# Patient Record
Sex: Male | Born: 1990 | Race: White | Hispanic: No
Health system: Southern US, Community
[De-identification: ages and names within clinical notes are randomized; demographics above are authoritative.]

## PROBLEM LIST (undated history)

## (undated) DIAGNOSIS — F840 Autistic disorder: Secondary | ICD-10-CM

## (undated) DIAGNOSIS — L709 Acne, unspecified: Secondary | ICD-10-CM

## (undated) HISTORY — DX: Acne, unspecified: L70.9

## (undated) HISTORY — DX: Autistic disorder: F84.0

---
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
## 2012-02-22 ENCOUNTER — Emergency Department: Payer: Self-pay | Admitting: Emergency Medicine

## 2012-02-22 LAB — CBC
HCT: 48.1 % (ref 40.0–52.0)
HGB: 15.8 g/dL (ref 13.0–18.0)
RBC: 5.41 10*6/uL (ref 4.40–5.90)
WBC: 9.8 10*3/uL (ref 3.8–10.6)

## 2012-02-22 LAB — DRUG SCREEN, URINE
Barbiturates, Ur Screen: NEGATIVE (ref ?–200)
Benzodiazepine, Ur Scrn: NEGATIVE (ref ?–200)
Cannabinoid 50 Ng, Ur ~~LOC~~: NEGATIVE (ref ?–50)
MDMA (Ecstasy)Ur Screen: NEGATIVE (ref ?–500)
Phencyclidine (PCP) Ur S: NEGATIVE (ref ?–25)

## 2012-02-22 LAB — ETHANOL: Ethanol: <3 mg/dL

## 2012-02-22 LAB — COMPREHENSIVE METABOLIC PANEL
Alkaline Phosphatase: 78 U/L (ref 50–136)
Anion Gap: 10 (ref 7–16)
BUN: 12 mg/dL (ref 7–18)
Calcium, Total: 9.2 mg/dL (ref 8.5–10.1)
Chloride: 104 mmol/L (ref 98–107)
Co2: 28 mmol/L (ref 21–32)
EGFR (African American): >60
EGFR (Non-African Amer.): >60
Osmolality: 283 (ref 275–301)
Potassium: 3.8 mmol/L (ref 3.5–5.1)

## 2012-02-22 LAB — TSH: Thyroid Stimulating Horm: 2.26 u[IU]/mL

## 2012-11-10 ENCOUNTER — Other Ambulatory Visit: Payer: Self-pay | Admitting: Family Medicine

## 2013-01-18 ENCOUNTER — Other Ambulatory Visit: Payer: Self-pay | Admitting: Family Medicine

## 2013-01-21 ENCOUNTER — Encounter: Payer: Self-pay | Admitting: *Deleted

## 2013-01-22 ENCOUNTER — Telehealth: Payer: Self-pay | Admitting: Family Medicine

## 2013-01-22 NOTE — Telephone Encounter (Signed)
This will be filled out in due process. I will forward it to the front tomorrow to [facility] thank you

## 2013-01-22 NOTE — Telephone Encounter (Signed)
Notified Luann this will be filled out in due process. Will forward it to the front tomorrow to [facility] per Dr. Lorin Picket. She verbalized understanding.

## 2013-01-22 NOTE — Telephone Encounter (Signed)
Calling to check on paperwork that was faxed over yesterday for patient to get communication device.  She says the sooner she gets it the better.

## 2013-01-23 ENCOUNTER — Other Ambulatory Visit: Payer: Self-pay | Admitting: *Deleted

## 2013-01-23 MED ORDER — TOPIRAMATE 100 MG PO TABS: 100.0000 mg | ORAL_TABLET | Freq: Two times a day (BID) | ORAL | Status: DC

## 2013-01-23 MED ORDER — ARIPIPRAZOLE 10 MG PO TABS: 10.0000 mg | ORAL_TABLET | Freq: Every day | ORAL | Status: DC

## 2013-01-23 MED ORDER — SULFAMETHOXAZOLE-TRIMETHOPRIM 800-160 MG PO TABS: 1.0000 | ORAL_TABLET | Freq: Two times a day (BID) | ORAL | Status: AC

## 2013-01-23 MED ORDER — ADAPALENE 0.1 % EX GEL
Freq: Every day | CUTANEOUS | Status: AC
Start: 2013-01-23 — End: ?

## 2013-01-23 MED ORDER — TOPIRAMATE 50 MG PO TABS: 50.0000 mg | ORAL_TABLET | Freq: Two times a day (BID) | ORAL | Status: DC

## 2013-01-23 MED ORDER — LISINOPRIL 5 MG PO TABS: 5.0000 mg | ORAL_TABLET | Freq: Every day | ORAL | Status: DC

## 2013-01-29 ENCOUNTER — Telehealth: Payer: Self-pay | Admitting: Family Medicine

## 2013-01-29 NOTE — Telephone Encounter (Signed)
Faxed back request to the attention of LuAnne Devall @ [employer] @ [phone]

## 2013-01-29 NOTE — Telephone Encounter (Signed)
Closed encounter - fax went out and received with a ok/status - kal

## 2013-01-30 NOTE — Telephone Encounter (Signed)
Ob/call to [employer], to confirm last fax update, she reviewed, looks like all information is in and complete. (c) [id number] (f) [phone] ** closed off encounter - kal

## 2013-03-12 ENCOUNTER — Telehealth: Payer: Self-pay | Admitting: Family Medicine

## 2013-03-12 MED ORDER — ARIPIPRAZOLE 10 MG PO TABS: 10.0000 mg | ORAL_TABLET | Freq: Every day | ORAL | Status: DC

## 2013-03-12 NOTE — Telephone Encounter (Signed)
Refill Ability 10mg .  Seeing psych on 8/22.  [facility]  Thanks

## 2013-03-12 NOTE — Telephone Encounter (Signed)
Med sent to pharm. Mother notified.  

## 2013-06-21 ENCOUNTER — Ambulatory Visit (INDEPENDENT_AMBULATORY_CARE_PROVIDER_SITE_OTHER): Payer: Medicaid Other | Admitting: Nurse Practitioner

## 2013-06-21 DIAGNOSIS — Z23 Encounter for immunization: Secondary | ICD-10-CM

## 2013-08-10 ENCOUNTER — Other Ambulatory Visit: Payer: Self-pay | Admitting: Family Medicine

## 2013-08-12 ENCOUNTER — Telehealth: Payer: Self-pay | Admitting: *Deleted

## 2013-08-12 MED ORDER — LISINOPRIL 5 MG PO TABS: 5.0000 mg | ORAL_TABLET | Freq: Every day | ORAL | Status: DC

## 2013-08-12 MED ORDER — SULFAMETHOXAZOLE-TMP DS 800-160 MG PO TABS: 1.0000 | ORAL_TABLET | Freq: Two times a day (BID) | ORAL | Status: DC

## 2013-08-12 NOTE — Telephone Encounter (Signed)
Ok plus one refill, rec 6 mo ov next month or so

## 2013-08-12 NOTE — Telephone Encounter (Signed)
Needs refill on bactrim DS one BID #60 and lisinopril 5mg  #30 one qd.

## 2013-08-12 NOTE — Telephone Encounter (Signed)
meds sent to pharm. Mother notified.   

## 2013-10-23 ENCOUNTER — Other Ambulatory Visit: Payer: Self-pay | Admitting: *Deleted

## 2013-10-23 MED ORDER — LISINOPRIL 5 MG PO TABS: 5.0000 mg | ORAL_TABLET | Freq: Every day | ORAL | Status: DC

## 2013-11-14 ENCOUNTER — Other Ambulatory Visit: Payer: Self-pay | Admitting: *Deleted

## 2013-11-14 ENCOUNTER — Telehealth: Payer: Self-pay | Admitting: *Deleted

## 2013-11-14 MED ORDER — TOPIRAMATE 50 MG PO TABS: 50.0000 mg | ORAL_TABLET | Freq: Two times a day (BID) | ORAL | Status: DC

## 2013-11-14 NOTE — Telephone Encounter (Signed)
Med sent to pharm. Mother notified.  

## 2013-11-14 NOTE — Telephone Encounter (Signed)
Requesting refill on topiramate 50mg . Has appt with psych on 05/29. [facility] [place] [facility] [address]

## 2013-11-14 NOTE — Telephone Encounter (Signed)
Ok plus five ref 

## 2013-11-18 ENCOUNTER — Telehealth: Payer: Self-pay | Admitting: *Deleted

## 2013-11-18 ENCOUNTER — Other Ambulatory Visit: Payer: Self-pay | Admitting: *Deleted

## 2013-11-18 MED ORDER — TOPIRAMATE 100 MG PO TABS: 100.0000 mg | ORAL_TABLET | Freq: Two times a day (BID) | ORAL | Status: DC

## 2013-11-18 NOTE — Telephone Encounter (Signed)
meds sent to pharm. Mother notified.   

## 2013-11-18 NOTE — Telephone Encounter (Signed)
Requesting refill on topamax 100mg . Has appt with psych 05/29. [facility] [address] [facility] [place].

## 2013-11-18 NOTE — Telephone Encounter (Signed)
Ok two ref 

## 2014-01-03 ENCOUNTER — Telehealth: Payer: Self-pay

## 2014-01-03 MED ORDER — KETOCONAZOLE 2 % EX CREA: 1.0000 "application " | TOPICAL_CREAM | Freq: Two times a day (BID) | CUTANEOUS | Status: AC | PRN

## 2014-01-03 NOTE — Telephone Encounter (Signed)
Patient needs a refill on Ketoconazole cream. [facility] [address] [place]

## 2014-01-03 NOTE — Telephone Encounter (Signed)
Medication sent to pharmacy. Jesus Franco was notified.

## 2014-01-03 NOTE — Telephone Encounter (Signed)
May give 6 refills 

## 2014-01-10 ENCOUNTER — Telehealth: Payer: Self-pay | Admitting: *Deleted

## 2014-01-10 MED ORDER — ACYCLOVIR 5 % EX CREA: 1.0000 "application " | TOPICAL_CREAM | CUTANEOUS | Status: AC

## 2014-01-10 NOTE — Telephone Encounter (Signed)
Ok ref times 12 

## 2014-01-10 NOTE — Telephone Encounter (Signed)
Rx sent electronically to pharmacy. Mother notified. 

## 2014-01-10 NOTE — Addendum Note (Signed)
Addended by: Margaretha Sheffield on: 01/10/2014 02:34 PM   Modules accepted: Orders

## 2014-01-10 NOTE — Telephone Encounter (Signed)
Need rx for zovirax cream for fever blisters

## 2014-01-31 ENCOUNTER — Other Ambulatory Visit: Payer: Self-pay | Admitting: Family Medicine

## 2014-02-07 ENCOUNTER — Telehealth: Payer: Self-pay | Admitting: *Deleted

## 2014-02-07 MED ORDER — ARIPIPRAZOLE 10 MG PO TABS: 10.0000 mg | ORAL_TABLET | Freq: Every day | ORAL | Status: DC

## 2014-02-07 NOTE — Telephone Encounter (Signed)
Requesting refill on abilify 10mg . Saw psych on 05/30.

## 2014-02-07 NOTE — Telephone Encounter (Signed)
Mom notified.

## 2014-02-07 NOTE — Telephone Encounter (Signed)
Ok times 30 d

## 2014-03-10 ENCOUNTER — Other Ambulatory Visit: Payer: Self-pay | Admitting: Family Medicine

## 2014-03-14 ENCOUNTER — Other Ambulatory Visit: Payer: Self-pay | Admitting: Family Medicine

## 2014-03-18 ENCOUNTER — Telehealth: Payer: Self-pay

## 2014-04-02 ENCOUNTER — Other Ambulatory Visit: Payer: Self-pay | Admitting: *Deleted

## 2014-04-02 MED ORDER — TOPIRAMATE 100 MG PO TABS: 100.0000 mg | ORAL_TABLET | Freq: Two times a day (BID) | ORAL | Status: DC

## 2014-04-02 MED ORDER — TOPIRAMATE 50 MG PO TABS: 50.0000 mg | ORAL_TABLET | Freq: Two times a day (BID) | ORAL | Status: DC

## 2014-05-13 ENCOUNTER — Telehealth: Payer: Self-pay | Admitting: *Deleted

## 2014-05-13 MED ORDER — ARIPIPRAZOLE 10 MG PO TABS: ORAL_TABLET | ORAL | Status: DC

## 2014-05-13 NOTE — Telephone Encounter (Signed)
Needs refill of Abilify has appt with psych 05/23/14

## 2014-05-13 NOTE — Addendum Note (Signed)
Addended by: Dereck LigasJOHNSON, Hashem Goynes P on: 05/13/2014 03:40 PM   Modules accepted: Orders

## 2014-05-13 NOTE — Telephone Encounter (Signed)
Medication sent to pharmacy. Eber JonesCarolyn was notified via text.

## 2014-05-13 NOTE — Telephone Encounter (Signed)
May refill this +6 additional refills, keep appointment with psychiatry as planned

## 2014-05-26 ENCOUNTER — Other Ambulatory Visit: Payer: Self-pay | Admitting: Family Medicine

## 2014-08-11 ENCOUNTER — Telehealth: Payer: Self-pay | Admitting: Family Medicine

## 2014-08-11 NOTE — Telephone Encounter (Signed)
Needed oct note [facility]

## 2014-09-17 ENCOUNTER — Other Ambulatory Visit: Payer: Self-pay | Admitting: Family Medicine

## 2014-10-17 ENCOUNTER — Telehealth: Payer: Self-pay | Admitting: Family Medicine

## 2014-10-17 NOTE — Telephone Encounter (Signed)
Rx prior auth APPROVED for pt's ARIPiprazole (ABILIFY) 10 MG tablet, Auth# [id number], expires 10/17/15, called [facility] [place] [place]

## 2014-11-08 ENCOUNTER — Other Ambulatory Visit: Payer: Self-pay | Admitting: Family Medicine

## 2014-11-10 NOTE — Telephone Encounter (Signed)
Renew lisinopril for 6 month ( eleiminate duplicate please)

## 2014-12-15 ENCOUNTER — Other Ambulatory Visit: Payer: Self-pay | Admitting: *Deleted

## 2014-12-15 MED ORDER — TOPIRAMATE 50 MG PO TABS: 50.0000 mg | ORAL_TABLET | Freq: Two times a day (BID) | ORAL | Status: DC

## 2014-12-15 MED ORDER — ARIPIPRAZOLE 10 MG PO TABS: ORAL_TABLET | ORAL | Status: AC

## 2014-12-15 MED ORDER — TOPIRAMATE 100 MG PO TABS: 100.0000 mg | ORAL_TABLET | Freq: Two times a day (BID) | ORAL | Status: DC

## 2014-12-31 ENCOUNTER — Encounter: Payer: Medicaid Other | Admitting: Family Medicine

## 2023-02-14 ENCOUNTER — Telehealth: Payer: Self-pay

## 2023-02-14 NOTE — Telephone Encounter (Signed)
Hi Autumn-any thoughts on this?

## 2023-02-14 NOTE — Telephone Encounter (Signed)
[facility] called said there was a order put in for [employer] should pop up where it is pending to go in and place the order. This is getting ready to expire order needs to be placed   [phone] if questions other wise go into the portal

## 2023-02-15 NOTE — Telephone Encounter (Signed)
Left message to return call 

## 2023-02-20 NOTE — Telephone Encounter (Signed)
Spoke with mother [facility]) and she stated this was supposed to go to psychiatrist and to disregard message

## 2024-06-10 ENCOUNTER — Other Ambulatory Visit: Payer: Self-pay | Admitting: Family Medicine

## 2024-06-10 MED ORDER — PROPRANOLOL HCL ER 60 MG PO CP24: 60.0000 mg | ORAL_CAPSULE | Freq: Every day | ORAL | 3 refills | Status: AC

## 2024-06-10 MED ORDER — OMEPRAZOLE 20 MG PO CPDR: 20.0000 mg | DELAYED_RELEASE_CAPSULE | Freq: Every day | ORAL | 3 refills | Status: DC

## 2024-06-10 MED ORDER — TOPIRAMATE 25 MG PO TABS: ORAL_TABLET | ORAL | 2 refills | Status: DC

## 2024-08-29 ENCOUNTER — Other Ambulatory Visit: Payer: Self-pay | Admitting: Family Medicine

## 2024-08-29 MED ORDER — PANTOPRAZOLE SODIUM 40 MG PO TBEC: 40.0000 mg | DELAYED_RELEASE_TABLET | Freq: Every day | ORAL | 6 refills | Status: AC

## 2024-08-29 NOTE — Progress Notes (Signed)
 Patient with intermittent regurgitation and reflux omeprazole  not really helping Change over to pantoprazole  Spoke with mother regarding this She will give us  feedback on how well that is doing

## 2024-09-01 ENCOUNTER — Other Ambulatory Visit: Payer: Self-pay | Admitting: Family Medicine

## 2024-09-10 ENCOUNTER — Other Ambulatory Visit: Payer: Self-pay | Admitting: Family Medicine

## 2024-09-10 MED ORDER — TRAZODONE HCL 100 MG PO TABS: 100.0000 mg | ORAL_TABLET | Freq: Every day | ORAL | 1 refills | Status: AC
